# Patient Record
Sex: Female | Born: 1985 | Race: White | Hispanic: No | Marital: Married | Smoking: Never smoker
Health system: Southern US, Community
[De-identification: ages and names within clinical notes are randomized; demographics above are authoritative.]

## PROBLEM LIST (undated history)

## (undated) DIAGNOSIS — T7840XA Allergy, unspecified, initial encounter: Secondary | ICD-10-CM

## (undated) DIAGNOSIS — N200 Calculus of kidney: Secondary | ICD-10-CM

## (undated) HISTORY — DX: Allergy, unspecified, initial encounter: T78.40XA

## (undated) HISTORY — DX: Calculus of kidney: N20.0

---

## 2005-06-02 DIAGNOSIS — N200 Calculus of kidney: Secondary | ICD-10-CM

## 2005-06-02 HISTORY — DX: Calculus of kidney: N20.0

## 2008-06-02 HISTORY — PX: MOUTH SURGERY: SHX715

## 2014-09-28 ENCOUNTER — Emergency Department (INDEPENDENT_AMBULATORY_CARE_PROVIDER_SITE_OTHER): Payer: 59

## 2014-09-28 ENCOUNTER — Emergency Department (INDEPENDENT_AMBULATORY_CARE_PROVIDER_SITE_OTHER)
Admission: EM | Admit: 2014-09-28 | Discharge: 2014-09-28 | Disposition: A | Discharge status: Home / Self Care | Payer: 59 | Source: Home / Self Care | Attending: Emergency Medicine | Admitting: Emergency Medicine

## 2014-09-28 ENCOUNTER — Encounter (HOSPITAL_COMMUNITY): Payer: Self-pay | Admitting: Emergency Medicine

## 2014-09-28 DIAGNOSIS — Z87828 Personal history of other (healed) physical injury and trauma: Secondary | ICD-10-CM

## 2014-09-28 DIAGNOSIS — J9801 Acute bronchospasm: Secondary | ICD-10-CM | POA: Diagnosis not present

## 2014-09-28 DIAGNOSIS — M542 Cervicalgia: Secondary | ICD-10-CM

## 2014-09-28 DIAGNOSIS — J302 Other seasonal allergic rhinitis: Secondary | ICD-10-CM

## 2014-09-28 MED ORDER — IPRATROPIUM BROMIDE 0.06 % NA SOLN: 2.0000 | Freq: Four times a day (QID) | NASAL | Status: AC

## 2014-09-28 MED ORDER — TRIAMCINOLONE ACETONIDE 40 MG/ML IJ SUSP
40.0000 mg | Freq: Once | INTRAMUSCULAR | Status: AC
  Administered 2014-09-28: 40 mg via INTRAMUSCULAR

## 2014-09-28 MED ORDER — ALBUTEROL SULFATE (2.5 MG/3ML) 0.083% IN NEBU
INHALATION_SOLUTION | RESPIRATORY_TRACT | Status: AC
  Filled 2014-09-28: qty 3

## 2014-09-28 MED ORDER — ALBUTEROL SULFATE HFA 108 (90 BASE) MCG/ACT IN AERS: 2.0000 | INHALATION_SPRAY | RESPIRATORY_TRACT | Status: AC | PRN

## 2014-09-28 MED ORDER — TRIAMCINOLONE ACETONIDE 40 MG/ML IJ SUSP
INTRAMUSCULAR | Status: AC
  Filled 2014-09-28: qty 1

## 2014-09-28 MED ORDER — IPRATROPIUM-ALBUTEROL 0.5-2.5 (3) MG/3ML IN SOLN
3.0000 mL | Freq: Once | RESPIRATORY_TRACT | Status: AC
  Administered 2014-09-28: 3 mL via RESPIRATORY_TRACT

## 2014-09-28 MED ORDER — ALBUTEROL SULFATE (2.5 MG/3ML) 0.083% IN NEBU
2.5000 mg | INHALATION_SOLUTION | Freq: Once | RESPIRATORY_TRACT | Status: AC
  Administered 2014-09-28: 2.5 mg via RESPIRATORY_TRACT

## 2014-09-28 MED ORDER — PREDNISONE 20 MG PO TABS: ORAL_TABLET | ORAL | Status: DC

## 2014-09-28 MED ORDER — IPRATROPIUM-ALBUTEROL 0.5-2.5 (3) MG/3ML IN SOLN
RESPIRATORY_TRACT | Status: AC
Start: 2014-09-28 — End: 2014-09-28
  Filled 2014-09-28: qty 3

## 2014-09-28 NOTE — ED Notes (Signed)
C/o  Fever.  Productive cough with yellow sputum.  Body aches.  Sore throat.  Sob.  Chest pain.     Also having neck and shoulder pain.   No relief with otc meds.   Symptoms present since Tuesday.

## 2014-09-28 NOTE — Discharge Instructions (Signed)
Allergic Rhinitis Flonase nasal spray Lots of saline nasal spray then blow your nose   Allergic rhinitis is when the mucous membranes in the nose respond to allergens. Allergens are particles in the air that cause your body to have an allergic reaction. This causes you to release allergic antibodies. Through a chain of events, these eventually cause you to release histamine into the blood stream. Although meant to protect the body, it is this release of histamine that causes your discomfort, such as frequent sneezing, congestion, and an itchy, runny nose.  CAUSES  Seasonal allergic rhinitis (hay fever) is caused by pollen allergens that may come from grasses, trees, and weeds. Year-round allergic rhinitis (perennial allergic rhinitis) is caused by allergens such as house dust mites, pet dander, and mold spores.  SYMPTOMS  1. Nasal stuffiness (congestion). 2. Itchy, runny nose with sneezing and tearing of the eyes. DIAGNOSIS  Your health care provider can help you determine the allergen or allergens that trigger your symptoms. If you and your health care provider are unable to determine the allergen, skin or blood testing may be used. TREATMENT  Allergic rhinitis does not have a cure, but it can be controlled by:  Medicines and allergy shots (immunotherapy).  Avoiding the allergen. Hay fever may often be treated with antihistamines in pill or nasal spray forms. Antihistamines block the effects of histamine. There are over-the-counter medicines that may help with nasal congestion and swelling around the eyes. Check with your health care provider before taking or giving this medicine.  If avoiding the allergen or the medicine prescribed do not work, there are many new medicines your health care provider can prescribe. Stronger medicine may be used if initial measures are ineffective. Desensitizing injections can be used if medicine and avoidance does not work. Desensitization is when a patient is  given ongoing shots until the body becomes less sensitive to the allergen. Make sure you follow up with your health care provider if problems continue. HOME CARE INSTRUCTIONS It is not possible to completely avoid allergens, but you can reduce your symptoms by taking steps to limit your exposure to them. It helps to know exactly what you are allergic to so that you can avoid your specific triggers. SEEK MEDICAL CARE IF:   You have a fever.  You develop a cough that does not stop easily (persistent).  You have shortness of breath.  You start wheezing.  Symptoms interfere with normal daily activities. Document Released: 02/11/2001 Document Revised: 05/24/2013 Document Reviewed: 01/24/2013 [facility] Patient Information 2015 [place], [place]. This information is not intended to replace advice given to you by your health care provider. Make sure you discuss any questions you have with your health care provider.  Upper Respiratory Infection, Adult An upper respiratory infection (URI) is also sometimes known as the common cold. The upper respiratory tract includes the nose, sinuses, throat, trachea, and bronchi. Bronchi are the airways leading to the lungs. Most people improve within 1 week, but symptoms can last up to 2 weeks. A residual cough may last even longer.  CAUSES Many different viruses can infect the tissues lining the upper respiratory tract. The tissues become irritated and inflamed and often become very moist. Mucus production is also common. A cold is contagious. You can easily spread the virus to others by oral contact. This includes kissing, sharing a glass, coughing, or sneezing. Touching your mouth or nose and then touching a surface, which is then touched by another person, can also spread the virus.  SYMPTOMS  Symptoms typically develop 1 to 3 days after you come in contact with a cold virus. Symptoms vary from person to person. They may include: 3. Runny  nose. 4. Sneezing. 5. Nasal congestion. 6. Sinus irritation. 7. Sore throat. 8. Loss of voice (laryngitis). 9. Cough. 10. Fatigue. 11. Muscle aches. 12. Loss of appetite. 13. Headache. 14. Low-grade fever. DIAGNOSIS  You might diagnose your own cold based on familiar symptoms, since most people get a cold 2 to 3 times a year. Your caregiver can confirm this based on your exam. Most importantly, your caregiver can check that your symptoms are not due to another disease such as strep throat, sinusitis, pneumonia, asthma, or epiglottitis. Blood tests, throat tests, and X-rays are not necessary to diagnose a common cold, but they may sometimes be helpful in excluding other more serious diseases. Your caregiver will decide if any further tests are required. RISKS AND COMPLICATIONS  You may be at risk for a more severe case of the common cold if you smoke cigarettes, have chronic heart disease (such as heart failure) or lung disease (such as asthma), or if you have a weakened immune system. The very young and very old are also at risk for more serious infections. Bacterial sinusitis, middle ear infections, and bacterial pneumonia can complicate the common cold. The common cold can worsen asthma and chronic obstructive pulmonary disease (COPD). Sometimes, these complications can require emergency medical care and may be life-threatening. PREVENTION  The best way to protect against getting a cold is to practice good hygiene. Avoid oral or hand contact with people with cold symptoms. Wash your hands often if contact occurs. There is no clear evidence that vitamin C, vitamin E, echinacea, or exercise reduces the chance of developing a cold. However, it is always recommended to get plenty of rest and practice good nutrition. TREATMENT  Treatment is directed at relieving symptoms. There is no cure. Antibiotics are not effective, because the infection is caused by a virus, not by bacteria. Treatment may  include:  Increased fluid intake. Sports drinks offer valuable electrolytes, sugars, and fluids.  Breathing heated mist or steam (vaporizer or shower).  Eating chicken soup or other clear broths, and maintaining good nutrition.  Getting plenty of rest.  Using gargles or lozenges for comfort.  Controlling fevers with ibuprofen or acetaminophen as directed by your caregiver.  Increasing usage of your inhaler if you have asthma. Zinc gel and zinc lozenges, taken in the first 24 hours of the common cold, can shorten the duration and lessen the severity of symptoms. Pain medicines may help with fever, muscle aches, and throat pain. A variety of non-prescription medicines are available to treat congestion and runny nose. Your caregiver can make recommendations and may suggest nasal or lung inhalers for other symptoms.  HOME CARE INSTRUCTIONS   Only take over-the-counter or prescription medicines for pain, discomfort, or fever as directed by your caregiver.  Use a warm mist humidifier or inhale steam from a shower to increase air moisture. This may keep secretions moist and make it easier to breathe.  Drink enough water and fluids to keep your urine clear or pale yellow.  Rest as needed.  Return to work when your temperature has returned to normal or as your caregiver advises. You may need to stay home longer to avoid infecting others. You can also use a face mask and careful hand washing to prevent spread of the virus. SEEK MEDICAL CARE IF:   After the first  few days, you feel you are getting worse rather than better.  You need your caregiver's advice about medicines to control symptoms.  You develop chills, worsening shortness of breath, or brown or red sputum. These may be signs of pneumonia.  You develop yellow or brown nasal discharge or pain in the face, especially when you bend forward. These may be signs of sinusitis.  You develop a fever, swollen neck glands, pain with  swallowing, or white areas in the back of your throat. These may be signs of strep throat. SEEK IMMEDIATE MEDICAL CARE IF:   You have a fever.  You develop severe or persistent headache, ear pain, sinus pain, or chest pain.  You develop wheezing, a prolonged cough, cough up blood, or have a change in your usual mucus (if you have chronic lung disease).  You develop sore muscles or a stiff neck. Document Released: 11/12/2000 Document Revised: 08/11/2011 Document Reviewed: 08/24/2013 [facility] Patient Information 2015 [place], [place]. This information is not intended to replace advice given to you by your health care provider. Make sure you discuss any questions you have with your health care provider.  How to Use an Inhaler Using your inhaler correctly is very important. Good technique will make sure that the medicine reaches your lungs.  HOW TO USE AN INHALER: 15. Take the cap off the inhaler. 16. If this is the first time using your inhaler, you need to prime it. Shake the inhaler for 5 seconds. Release four puffs into the air, away from your face. Ask your doctor for help if you have questions. 17. Shake the inhaler for 5 seconds. 18. Turn the inhaler so the bottle is above the mouthpiece. 19. Put your pointer finger on top of the bottle. Your thumb holds the bottom of the inhaler. 20. Open your mouth. 21. Either hold the inhaler away from your mouth (the width of 2 fingers) or place your lips tightly around the mouthpiece. Ask your doctor which way to use your inhaler. 22. Breathe out as much air as possible. 23. Breathe in and push down on the bottle 1 time to release the medicine. You will feel the medicine go in your mouth and throat. 24. Continue to take a deep breath in very slowly. Try to fill your lungs. 25. After you have breathed in completely, hold your breath for 10 seconds. This will help the medicine to settle in your lungs. If you cannot hold your breath for 10 seconds, hold  it for as long as you can before you breathe out. 26. Breathe out slowly, through pursed lips. Whistling is an example of pursed lips. 27. If your doctor has told you to take more than 1 puff, wait at least 15-30 seconds between puffs. This will help you get the best results from your medicine. Do not use the inhaler more than your doctor tells you to. 28. Put the cap back on the inhaler. 29. Follow the directions from your doctor or from the inhaler package about cleaning the inhaler. If you use more than one inhaler, ask your doctor which inhalers to use and what order to use them in. Ask your doctor to help you figure out when you will need to refill your inhaler.  If you use a steroid inhaler, always rinse your mouth with water after your last puff, gargle and spit out the water. Do not swallow the water. GET HELP IF:  The inhaler medicine only partially helps to stop wheezing or shortness of breath.  You are having trouble using your inhaler.  You have some increase in thick spit (phlegm). GET HELP RIGHT AWAY IF:  The inhaler medicine does not help your wheezing or shortness of breath or you have tightness in your chest.  You have dizziness, headaches, or fast heart rate.  You have chills, fever, or night sweats.  You have a large increase of thick spit, or your thick spit is bloody. MAKE SURE YOU:    Cervical Sprain A cervical sprain is when the tissues (ligaments) that hold the neck bones in place stretch or tear. HOME CARE  Put ice on the injured area. Put ice in a plastic bag. Place a towel between your skin and the bag. Leave the ice on for 15-20 minutes, 3-4 times a day. You may have been given a collar to wear. This collar keeps your neck from moving while you heal. Do not take the collar off unless told by your doctor. If you have long hair, keep it outside of the collar. Ask your doctor before changing the position of your collar. You may need to change its position  over time to make it more comfortable. If you are allowed to take off the collar for cleaning or bathing, follow your doctor's instructions on how to do it safely. Keep your collar clean by wiping it with mild soap and water. Dry it completely. If the collar has removable pads, remove them every 1-2 days to hand wash them with soap and water. Allow them to air dry. They should be dry before you wear them in the collar. Do not drive while wearing the collar. Only take medicine as told by your doctor. Keep all doctor visits as told. Keep all physical therapy visits as told. Adjust your work station so that you have good posture while you work. Avoid positions and activities that make your problems worse. Warm up and stretch before being active. GET HELP IF: Your pain is not controlled with medicine. You cannot take less pain medicine over time as planned. Your activity level does not improve as expected. GET HELP RIGHT AWAY IF:  You are bleeding. Your stomach is upset. You have an allergic reaction to your medicine. You develop new problems that you cannot explain. You lose feeling (become numb) or you cannot move any part of your body (paralysis). You have tingling or weakness in any part of your body. Your symptoms get worse. Symptoms include: Pain, soreness, stiffness, puffiness (swelling), or a burning feeling in your neck. Pain when your neck is touched. Shoulder or upper back pain. Limited ability to move your neck. Headache. Dizziness. Your hands or arms feel week, lose feeling, or tingle. Muscle spasms. Difficulty swallowing or chewing. MAKE SURE YOU:  Understand these instructions. Will watch your condition. Will get help right away if you are not doing well or get worse. Document Released: 11/05/2007 Document Revised: 01/19/2013 Document Reviewed: 11/24/2012 [facility] Patient Information 2015 [place], [place]. This information is not intended to replace advice given to you  by your health care provider. Make sure you discuss any questions you have with your health care provider.  Understand these instructions.  Will watch your condition. Bronchospasm A bronchospasm is a spasm or tightening of the airways going into the lungs. During a bronchospasm breathing becomes more difficult because the airways get smaller. When this happens there can be coughing, a whistling sound when breathing (wheezing), and difficulty breathing. Bronchospasm is often associated with asthma, but not all patients who experience  a bronchospasm have asthma. CAUSES  A bronchospasm is caused by inflammation or irritation of the airways. The inflammation or irritation may be triggered by:  Allergies (such as to animals, pollen, food, or mold). Allergens that cause bronchospasm may cause wheezing immediately after exposure or many hours later.  Infection. Viral infections are believed to be the most common cause of bronchospasm.  Exercise.  Irritants (such as pollution, cigarette smoke, strong odors, aerosol sprays, and paint fumes).  Weather changes. Winds increase molds and pollens in the air. Rain refreshes the air by washing irritants out. Cold air may cause inflammation.  Stress and emotional upset.  SIGNS AND SYMPTOMS  Wheezing.  Excessive nighttime coughing.  Frequent or severe coughing with a simple cold.  Chest tightness.  Shortness of breath.  DIAGNOSIS  Bronchospasm is usually diagnosed through a history and physical exam. Tests, such as chest X-rays, are sometimes done to look for other conditions. TREATMENT  Inhaled medicines can be given to open up your airways and help you breathe. The medicines can be given using either an inhaler or a nebulizer machine. Corticosteroid medicines may be given for severe bronchospasm, usually when it is associated with asthma. HOME CARE INSTRUCTIONS  Always have a plan prepared for seeking medical care. Know when to call your health  care provider and local emergency services (911 in the [place] Know where you can access local emergency care. Only take medicines as directed by your health care provider. If you were prescribed an inhaler or nebulizer machine, ask your health care provider to explain how to use it correctly. Always use a spacer with your inhaler if you were given one. It is necessary to remain calm during an attack. Try to relax and breathe more slowly. Control your home environment in the following ways:  Change your heating and air conditioning filter at least once a month.  Limit your use of fireplaces and wood stoves. Do not smoke and do not allow smoking in your home.  Avoid exposure to perfumes and fragrances.  Get rid of pests (such as roaches and mice) and their droppings.  Throw away plants if you see mold on them.  Keep your house clean and dust free.  Replace carpet with wood, tile, or vinyl flooring. Carpet can trap dander and dust.  Use allergy-proof pillows, mattress covers, and box spring covers.  Wash bed sheets and blankets every week in hot water and dry them in a dryer.  Use blankets that are made of polyester or cotton.  Wash hands frequently. SEEK MEDICAL CARE IF:  You have muscle aches.  You have chest pain.  The sputum changes from clear or white to yellow, green, gray, or bloody.  The sputum you cough up gets thicker.  There are problems that may be related to the medicine you are given, such as a rash, itching, swelling, or trouble breathing.  SEEK IMMEDIATE MEDICAL CARE IF:  You have worsening wheezing and coughing even after taking your prescribed medicines.  You have increased difficulty breathing.  You develop severe chest pain. MAKE SURE YOU:  Understand these instructions. Will watch your condition. Will get help right away if you are not doing well or get worse. Document Released: 05/22/2003 Document Revised: 05/24/2013 Document Reviewed:  11/08/2012 [facility] Patient Information 2015 [place]. This information is not intended to replace advice given to you by your health care provider. Make sure you discuss any questions you have with your health care provider.   Will get  help right away if you are not doing well or get worse. Document Released: 02/26/2008 Document Revised: 03/09/2013 Document Reviewed: 12/16/2012 [facility] Patient Information 2015 [place], [place]. This information is not intended to replace advice given to you by your health care provider. Make sure you discuss any questions you have with your health care provider.

## 2014-09-28 NOTE — ED Provider Notes (Addendum)
CSN: [id number]     Arrival date & time 09/28/14  1856 History   First MD Initiated Contact with Patient 09/28/14 2034     Chief Complaint  Patient presents with  . Influenza   (Consider location/radiation/quality/duration/timing/severity/associated sxs/prior Treatment) HPI Comments: 29 year old female presents with a 48 hour history of cough, nasal congestion, body aches, chest pain particularly with cough and today developing a fever. She has taken Tylenol only.  Her second complaint is that of posterior neck pain. She was playing rugby approximately 2 weeks ago and she had a head impaction with another player and she experience pain in the lower cervical spine. She had a similar-type injury 1 week ago and began injured her neck. She states she felt pins and needles in her whole body like her nursing home fire and has an occasional numbness to her bilateral thumbs. She has limited range of motion to her neck.    History reviewed. No pertinent past medical history. History reviewed. No pertinent past surgical history. History reviewed. No pertinent family history. History  Substance Use Topics  . Smoking status: Never Smoker   . Smokeless tobacco: Not on file  . Alcohol Use: No   OB History    No data available     Review of Systems  Constitutional: Positive for fever and activity change.  HENT: Positive for congestion, postnasal drip and sinus pressure. Negative for facial swelling and trouble swallowing.   Respiratory: Positive for cough and shortness of breath.   Cardiovascular: Positive for chest pain. Negative for palpitations.  Gastrointestinal: Negative.   Musculoskeletal: Positive for neck pain.  Skin: Negative.   Neurological: Negative for dizziness, tremors, syncope, facial asymmetry, speech difficulty and headaches.    Allergies  Review of patient's allergies indicates no known allergies.  Home Medications   Prior to Admission medications   Medication Sig Start  Date End Date Taking? Authorizing Provider  albuterol (PROVENTIL HFA;VENTOLIN HFA) 108 (90 BASE) MCG/ACT inhaler Inhale 2 puffs into the lungs every 4 (four) hours as needed for wheezing or shortness of breath. 09/28/14   Hayden Rasmussenavid Augusto Deckman, NP  ipratropium (ATROVENT) 0.06 % nasal spray Place 2 sprays into both nostrils 4 (four) times daily. 09/28/14   Hayden Rasmussenavid Lempi Edwin, NP  predniSONE (DELTASONE) 20 MG tablet Take 3 tabs po on first day, 2 tabs second day, 2 tabs third day, 1 tab fourth day, 1 tab 5th day. Take with food. 09/28/14   Hayden Rasmussenavid Elmire Amrein, NP   BP 117/82 mmHg  Pulse 83  Temp(Src) 99.1 F (37.3 C) (Oral)  Resp 20  SpO2 98%  LMP 09/13/2014 Physical Exam  Constitutional: She is oriented to person, place, and time. She appears well-developed and well-nourished. No distress.  HENT:  Mouth/Throat: No oropharyngeal exudate.  Bilateral TMs are normal Oropharynx with PND, mild erythema and cobblestoning.  Eyes: EOM are normal. Pupils are equal, round, and reactive to light.  Neck:  Tenderness at the base of the cervical spine and T1. She is able to rotate the neck approximate 30 left and right and flex for approximately 45. No deformity, swelling, discoloration or abnormal bony movement/crepitus or obvious malalignment.  Cardiovascular: Normal rate, regular rhythm and normal heart sounds.   Pulmonary/Chest: Effort normal. She has wheezes. She has rales.  Bilateral chest sounds with coarseness, rhonchi and wheezing. Expiratory phase prolonged.  Musculoskeletal: She exhibits no edema or tenderness.  Lymphadenopathy:    She has no cervical adenopathy.  Neurological: She is alert and oriented to person, place, and  time. No cranial nerve deficit. She exhibits normal muscle tone. Coordination normal.  Skin: Skin is warm and dry.  Nursing note and vitals reviewed.   ED Course  Procedures (including critical care time) Labs Review Labs Reviewed - No data to display  Imaging Review Dg Chest 2  View  09/28/2014   CLINICAL DATA:  29 year old female with cough, fever, body aches and sore throat  EXAM: CHEST  2 VIEW  COMPARISON:  None.  FINDINGS: The lungs are clear and negative for focal airspace consolidation, pulmonary edema or suspicious pulmonary nodule. No pleural effusion or pneumothorax. Cardiac and mediastinal contours are within normal limits. No acute fracture or lytic or blastic osseous lesions. The visualized upper abdominal bowel gas pattern is unremarkable.  IMPRESSION: Normal chest x-ray.   Electronically Signed   By: Malachy Moan M.D.   On: 09/28/2014 21:23     MDM   1. Other seasonal allergic rhinitis   2. Acute bronchospasm   3. Posterior neck pain   4. History of cervical spine trauma   Kenalog 40 mg IM Post DuoNeb 5 mg/2.5 mg there is potential and improvement and air movement and near absence of adventitious sounds. Lungs are primarily clear with rare wheeze. Patient feels better and breathing better. X-ray as above, no evidence of pneumonia.  Was recommended to the patient that she have a C-spine x-ray series. After consulting with Dr. Piedad Climes at 8:45 PM it was decided that it would be best to send her to the emergency department for C-spine evaluation after we treat her respiratory illness. In discussing this with patient she stated that she could not afford to do that and request a referral to a spinal specialist or orthopedist. She will also be placed in a soft c-collar and be on self restricted activity. Take ibuprofen or Aleve as directed for the next 2-3 days for inflammation Alka-Seltzer cold plus medicine for the next couple days. Then switch to Allegra or Claritin. Use lots of saline nasal spray Flonase nasal spray Atrovent nasal spray as directed Lots of fluids Albuterol HFA Prednisone taper Must follow-up with orthopedist as above for your neck. Wear c-collar until the physician states it is okay not to.    Hayden Rasmussen, NP 09/28/14 2156  Hayden Rasmussen, NP 10/12/14 2131

## 2014-10-16 ENCOUNTER — Encounter: Payer: Self-pay | Admitting: Physician Assistant

## 2014-10-16 ENCOUNTER — Ambulatory Visit (INDEPENDENT_AMBULATORY_CARE_PROVIDER_SITE_OTHER): Payer: 59 | Admitting: Physician Assistant

## 2014-10-16 VITALS — BP 106/68 | HR 65 | Temp 97.9°F | Resp 16 | Ht 65.25 in | Wt 168.2 lb

## 2014-10-16 DIAGNOSIS — Z23 Encounter for immunization: Secondary | ICD-10-CM

## 2014-10-16 DIAGNOSIS — Z130 Encounter for screening for diseases of the blood and blood-forming organs and certain disorders involving the immune mechanism: Secondary | ICD-10-CM

## 2014-10-16 DIAGNOSIS — Z113 Encounter for screening for infections with a predominantly sexual mode of transmission: Secondary | ICD-10-CM

## 2014-10-16 DIAGNOSIS — Z131 Encounter for screening for diabetes mellitus: Secondary | ICD-10-CM | POA: Diagnosis not present

## 2014-10-16 DIAGNOSIS — Z1329 Encounter for screening for other suspected endocrine disorder: Secondary | ICD-10-CM

## 2014-10-16 DIAGNOSIS — M542 Cervicalgia: Secondary | ICD-10-CM

## 2014-10-16 DIAGNOSIS — Z13220 Encounter for screening for lipoid disorders: Secondary | ICD-10-CM | POA: Diagnosis not present

## 2014-10-16 DIAGNOSIS — Z0000 Encounter for general adult medical examination without abnormal findings: Secondary | ICD-10-CM | POA: Diagnosis not present

## 2014-10-16 LAB — CBC
HEMATOCRIT: 36 % (ref 36.0–46.0)
Hemoglobin: 11.9 g/dL — ABNORMAL LOW (ref 12.0–15.0)
MCH: 29.8 pg (ref 26.0–34.0)
MCHC: 33.1 g/dL (ref 30.0–36.0)
MCV: 90.2 fL (ref 78.0–100.0)
MPV: 9.9 fL (ref 8.6–12.4)
Platelets: 310 10*3/uL (ref 150–400)
RBC: 3.99 MIL/uL (ref 3.87–5.11)
RDW: 13.8 % (ref 11.5–15.5)
WBC: 9.6 10*3/uL (ref 4.0–10.5)

## 2014-10-16 NOTE — Progress Notes (Signed)
   Subjective:    Patient ID: [name], female    DOB: Mar 31, 1986, 29 y.o.   MRN: [id number]  HPI    Review of Systems  Constitutional: Negative.   HENT: Negative.   Eyes: Negative.   Respiratory: Negative.   Cardiovascular: Negative.   Gastrointestinal: Negative.   Endocrine: Negative.   Genitourinary: Negative.   Musculoskeletal: Positive for myalgias and neck pain.  Skin: Negative.   Allergic/Immunologic: Positive for environmental allergies.  Neurological: Negative.   Hematological: Negative.   Psychiatric/Behavioral: Negative.        Objective:   Physical Exam        Assessment & Plan:

## 2014-10-16 NOTE — Progress Notes (Signed)
Subjective:    Patient ID: [name], female    DOB: Mar 01, 1986, 29 y.o.   MRN: [id number]  HPI Patient presents for complete physical without any complaints. States that she has been healthy with PMH positive for seasonal allergies and single episode of kidney Gamble in 2007. Denies surgeries save oral surgery for tooth extraction.   Has been married for 4 years (Sept 2011) to female partner and she adds that they are no longer trying to have children has wife had 2 miscarriage over past 2 years. 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 Stays active by running 3 miles 3x/week and biking 10 miles to work daily. Eats a healthy diet full of vegetables and fruits. Drinks water mostly and sweet tea daily. Does not smoke save trying once in high school and denies illicit drugs use. Drinks alcohol 2-3x/month.   Plays rugby and ran into another player's shoulder with her shoulders "the ladder" and had sharp 8/10 neck pain 2 months ago and then had similar incident 1 week following with same exact pain. Heard weird sound and had tingling in thumbs for several days. Tingling has since resolved and pain has improved to 2/10 dull pain. Would like referral to chiropractor.   Health Maintenance: UTD on eye exams. Getting TDAP today. UTD on meningitis vaccine. Last pap 2015 was normal. Does self breast exams. Has gotten 1/3 Gardisil vaccines.   Family hx: mother, brother, father all healthy. Maternal grandmother with skin cancer and DM.   Review of Systems  Constitutional: Negative.  Negative for activity change, appetite change and fatigue.  HENT: Negative.   Eyes: Negative.  Negative for photophobia and visual disturbance.  Respiratory: Negative for cough, shortness of breath and wheezing.   Cardiovascular: Negative.  Negative for chest pain and leg swelling.  Gastrointestinal: Negative.  Negative for nausea, vomiting, diarrhea, constipation and blood in stool.    Endocrine: Negative.   Genitourinary: Negative.   Musculoskeletal: Positive for neck pain. Negative for myalgias, back pain, joint swelling, arthralgias and neck stiffness.  Skin: Negative.   Allergic/Immunologic: Positive for environmental allergies.  Neurological: Negative.  Negative for dizziness, weakness, numbness and headaches.       Objective:   Physical Exam  Constitutional: She is oriented to person, place, and time. She appears well-developed and well-nourished. No distress.  Blood pressure 106/68, pulse 65, temperature 97.9 F (36.6 C), temperature source Oral, resp. rate 16, height 5' 5.25" (1.657 m), weight 168 lb 3.2 oz (76.295 kg), last menstrual period 10/14/2014, SpO2 100 %.  HENT:  Head: Normocephalic and atraumatic.  Right Ear: External ear normal.  Left Ear: External ear normal.  Mouth/Throat: Oropharynx is clear and moist. No oropharyngeal exudate.  Eyes: Conjunctivae and EOM are normal. Pupils are equal, round, and reactive to light. Right eye exhibits no discharge. Left eye exhibits no discharge. No scleral icterus.  Neck: Normal range of motion. Neck supple. No JVD present. No thyromegaly present.  Cardiovascular: Normal rate, regular rhythm, normal heart sounds and intact distal pulses.  Exam reveals no gallop and no friction rub.   No murmur heard. Pulmonary/Chest: Effort normal and breath sounds normal. No respiratory distress. She has no wheezes. She has no rales. She exhibits no tenderness.  Abdominal: Soft. Bowel sounds are normal. She exhibits no distension. There is no tenderness. There is no rebound and no guarding.  Musculoskeletal: Normal range of motion. She exhibits no edema or tenderness.  Right shoulder: Normal.       Left shoulder: Normal.       Cervical back: She exhibits pain. She exhibits normal range of motion, no tenderness, no bony tenderness, no swelling, no edema, no deformity, no laceration and no spasm.       Thoracic back: Normal.        Lumbar back: Normal.  Lymphadenopathy:    She has no cervical adenopathy.  Neurological: She is alert and oriented to person, place, and time. She has normal strength and normal reflexes. She displays no atrophy. No cranial nerve deficit or sensory deficit. She exhibits normal muscle tone. Coordination normal.  Skin: Skin is warm and dry. No rash noted. She is not diaphoretic. No erythema. No pallor.  Psychiatric: She has a normal mood and affect. Her behavior is normal. Judgment and thought content normal.      Assessment & Plan:  1. Physical exam Age anticipatory guidance given.  2. Screening for deficiency anemia - CBC  3. Screening for diabetes mellitus - Comprehensive metabolic panel  4. Screening cholesterol level - Lipid panel  5. Screening for thyroid disorder - TSH  6. Screen for STD (sexually transmitted disease) Unable to give urine for GC probe and did not want to have pelvic for swab.  - RPR - HIV antibody  7. Need for Tdap vaccination - Tdap vaccine greater than or equal to 7yo IM  8. Neck pain Patient would rather have referral to [facility] and Chiropractor than to Ortho. - Amb ref Integr[facility] - Amb ref Chiropractor   Janan Ridgeishira Rose Hippler PA-C  [facility] 10/16/2014 7:41 PM

## 2014-10-16 NOTE — Patient Instructions (Signed)

## 2014-10-17 ENCOUNTER — Ambulatory Visit: Payer: 59 | Admitting: Family Medicine

## 2014-10-17 ENCOUNTER — Encounter: Payer: Self-pay | Admitting: Physician Assistant

## 2014-10-17 LAB — LIPID PANEL
Cholesterol: 146 mg/dL (ref 0–200)
HDL: 51 mg/dL (ref >=46)
LDL Cholesterol: 77 mg/dL (ref 0–99)
Total CHOL/HDL Ratio: 2.9 Ratio
Triglycerides: 89 mg/dL (ref <150)
VLDL: 18 mg/dL (ref 0–40)

## 2014-10-17 LAB — COMPREHENSIVE METABOLIC PANEL
ALK PHOS: 50 U/L (ref 39–117)
ALT: 9 U/L (ref 0–35)
AST: 14 U/L (ref 0–37)
Albumin: 4.3 g/dL (ref 3.5–5.2)
BUN: 10 mg/dL (ref 6–23)
CO2: 27 mEq/L (ref 19–32)
Calcium: 9.2 mg/dL (ref 8.4–10.5)
Chloride: 104 mEq/L (ref 96–112)
Creat: 0.72 mg/dL (ref 0.50–1.10)
GLUCOSE: 91 mg/dL (ref 70–99)
Potassium: 4.3 mEq/L (ref 3.5–5.3)
SODIUM: 139 meq/L (ref 135–145)
TOTAL PROTEIN: 6.9 g/dL (ref 6.0–8.3)
Total Bilirubin: 0.5 mg/dL (ref 0.2–1.2)

## 2014-10-17 LAB — RPR

## 2014-10-17 LAB — HIV ANTIBODY (ROUTINE TESTING W REFLEX): HIV 1&2 Ab, 4th Generation: NONREACTIVE

## 2014-10-17 LAB — TSH: TSH: 0.926 u[IU]/mL (ref 0.350–4.500)

## 2015-03-27 ENCOUNTER — Other Ambulatory Visit: Payer: Self-pay | Admitting: Occupational Medicine

## 2015-03-27 ENCOUNTER — Emergency Department (HOSPITAL_COMMUNITY)
Admission: EM | Admit: 2015-03-27 | Discharge: 2015-03-27 | Disposition: A | Discharge status: Home / Self Care | Payer: 59 | Source: Home / Self Care

## 2015-03-27 ENCOUNTER — Ambulatory Visit: Payer: Self-pay

## 2015-03-27 DIAGNOSIS — R52 Pain, unspecified: Secondary | ICD-10-CM

## 2016-10-02 IMAGING — DX DG CHEST 2V
2 series · 2 of 2 positions shown · non-contrast
Comparison: None.

CLINICAL DATA: 28-year-old female with cough, fever, body aches and
sore throat

EXAM:
CHEST  2 VIEW

[chest pa]
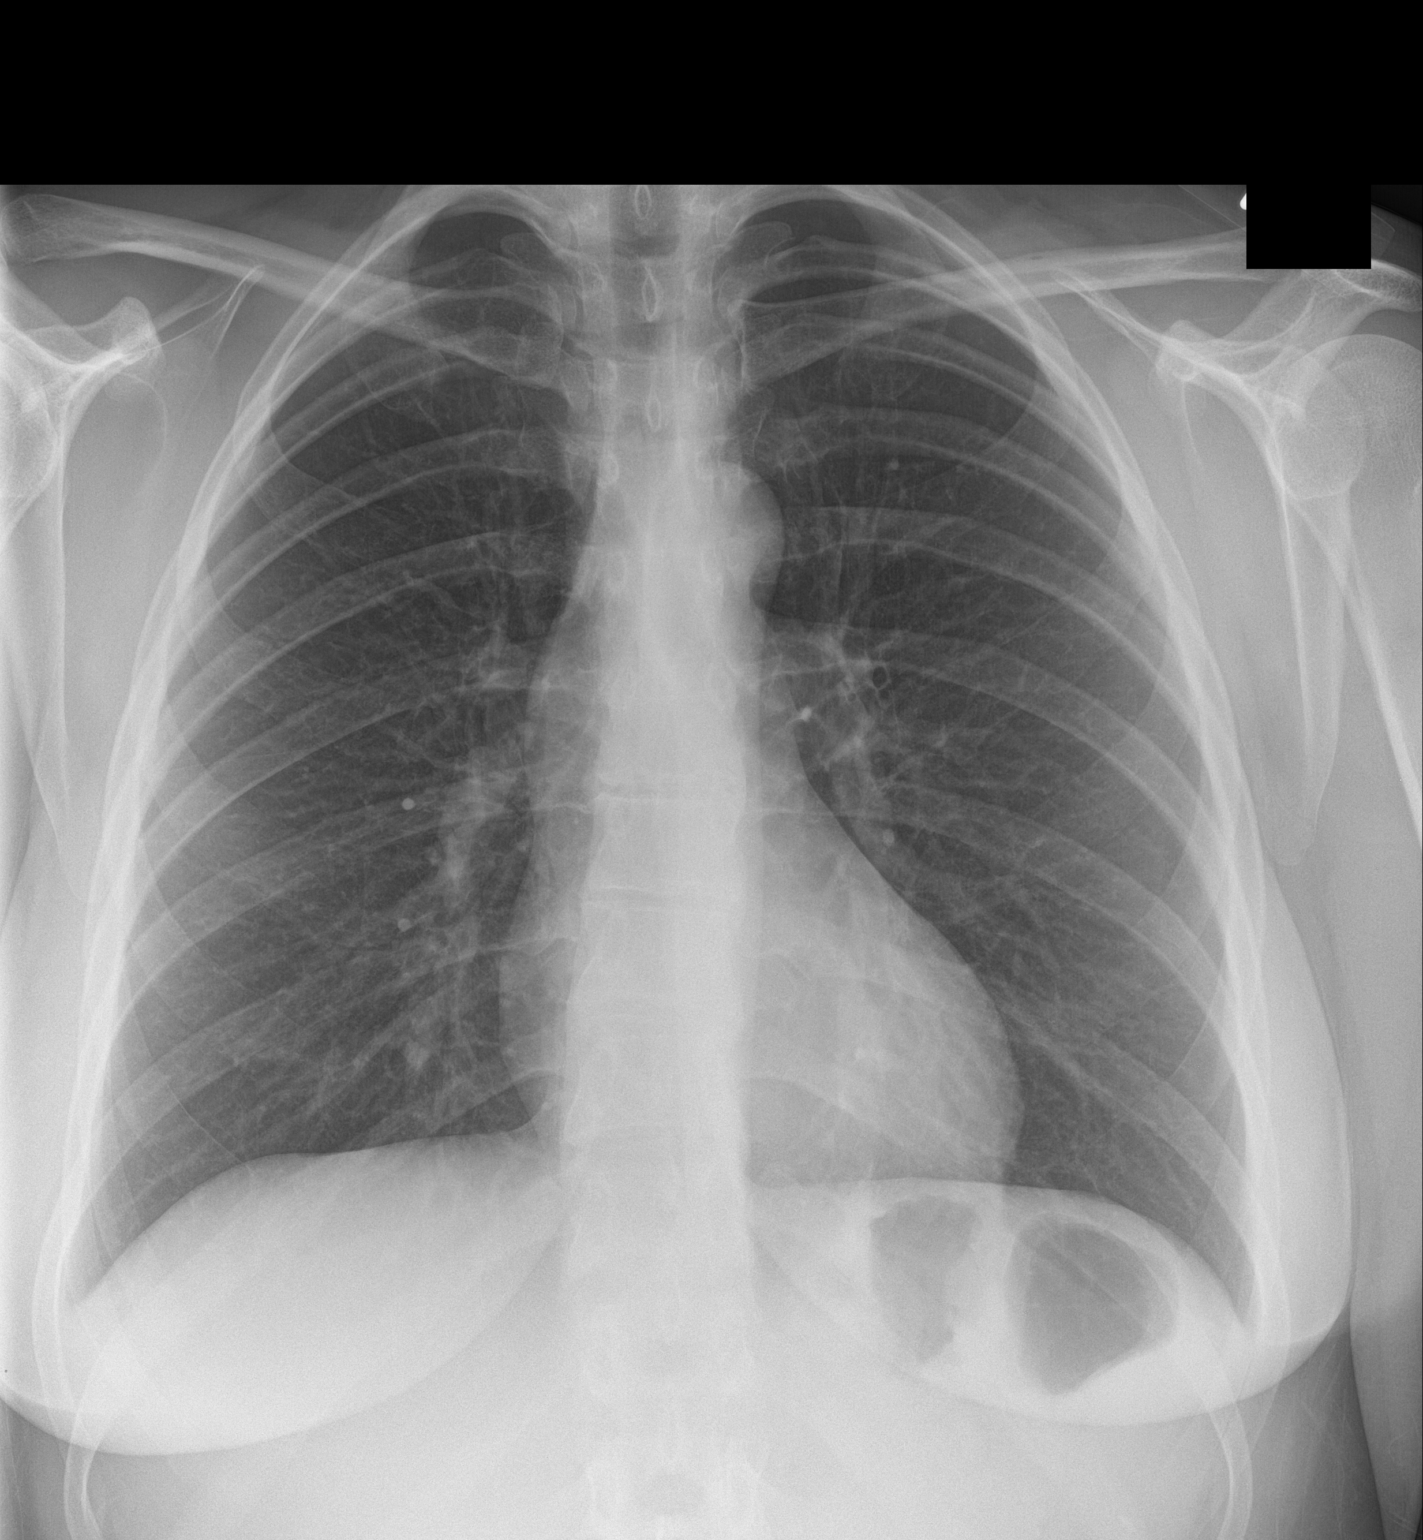

[chest lat]
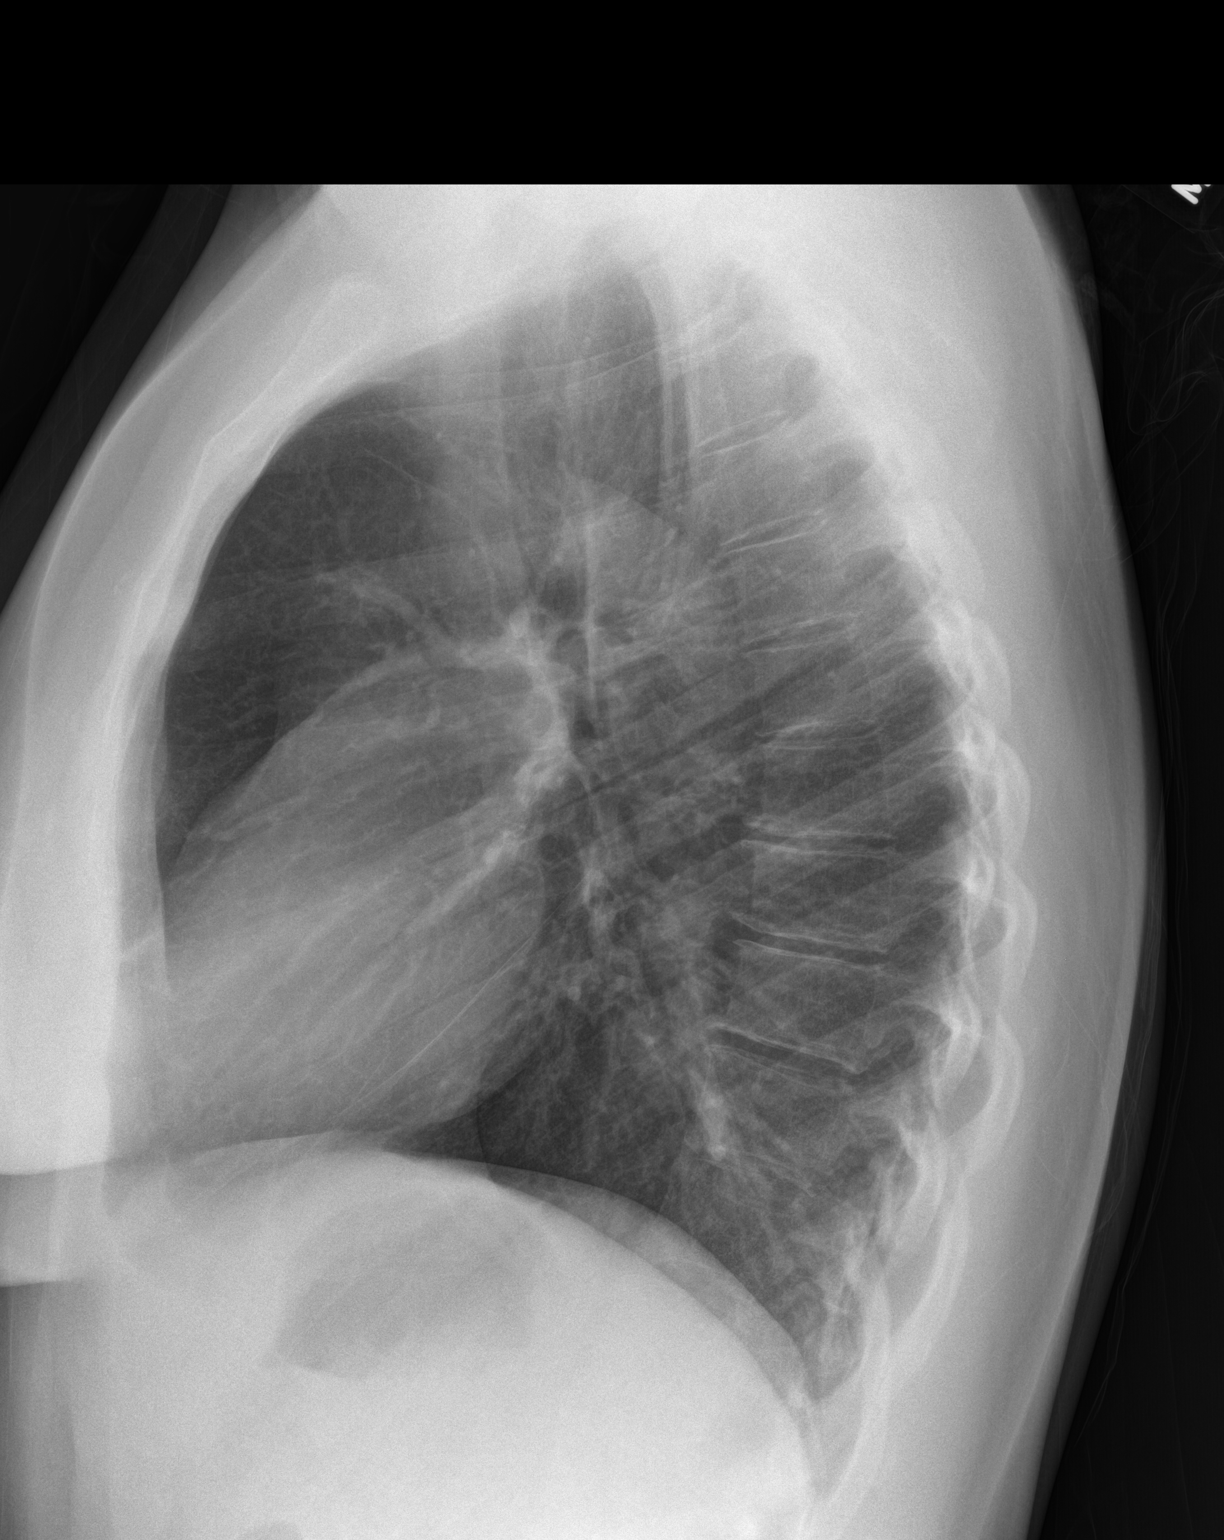

[2 of 2 positions shown; findings below may reference images not displayed]

FINDINGS: The lungs are clear and negative for focal airspace consolidation,
pulmonary edema or suspicious pulmonary nodule. No pleural effusion
or pneumothorax. Cardiac and mediastinal contours are within normal
limits. No acute fracture or lytic or blastic osseous lesions. The
visualized upper abdominal bowel gas pattern is unremarkable.
IMPRESSION: Normal chest x-ray.

## 2018-05-11 ENCOUNTER — Ambulatory Visit: Payer: Self-pay | Admitting: Physician Assistant

## 2018-05-12 ENCOUNTER — Ambulatory Visit: Payer: Self-pay | Admitting: Physician Assistant

## 2018-05-13 ENCOUNTER — Ambulatory Visit: Payer: Self-pay | Admitting: Physician Assistant

## 2019-05-04 ENCOUNTER — Other Ambulatory Visit: Payer: Self-pay

## 2019-05-04 DIAGNOSIS — Z20822 Contact with and (suspected) exposure to covid-19: Secondary | ICD-10-CM

## 2019-05-06 LAB — NOVEL CORONAVIRUS, NAA: SARS-CoV-2, NAA: NOT DETECTED

## 2019-05-09 ENCOUNTER — Telehealth: Payer: Self-pay

## 2019-05-09 NOTE — Telephone Encounter (Signed)
Patient given negative result and verbalized understanding  

## 2019-07-18 ENCOUNTER — Ambulatory Visit: Payer: BC Managed Care – PPO | Attending: Internal Medicine

## 2019-07-18 ENCOUNTER — Other Ambulatory Visit: Payer: Self-pay

## 2019-07-18 DIAGNOSIS — Z20822 Contact with and (suspected) exposure to covid-19: Secondary | ICD-10-CM | POA: Insufficient documentation

## 2019-07-19 LAB — NOVEL CORONAVIRUS, NAA: SARS-CoV-2, NAA: NOT DETECTED

## 2020-02-29 ENCOUNTER — Other Ambulatory Visit: Payer: Self-pay

## 2020-02-29 DIAGNOSIS — Z20822 Contact with and (suspected) exposure to covid-19: Secondary | ICD-10-CM

## 2020-03-01 LAB — NOVEL CORONAVIRUS, NAA: SARS-CoV-2, NAA: NOT DETECTED

## 2020-03-01 LAB — SARS-COV-2, NAA 2 DAY TAT

## 2020-03-31 ENCOUNTER — Other Ambulatory Visit: Payer: BC Managed Care – PPO

## 2020-03-31 ENCOUNTER — Other Ambulatory Visit: Payer: Self-pay

## 2020-03-31 DIAGNOSIS — Z20822 Contact with and (suspected) exposure to covid-19: Secondary | ICD-10-CM

## 2020-04-01 LAB — NOVEL CORONAVIRUS, NAA: SARS-CoV-2, NAA: NOT DETECTED

## 2020-04-01 LAB — SARS-COV-2, NAA 2 DAY TAT

## 2020-06-20 ENCOUNTER — Other Ambulatory Visit: Payer: BC Managed Care – PPO

## 2021-07-12 DIAGNOSIS — R69 Illness, unspecified: Secondary | ICD-10-CM | POA: Diagnosis not present

## 2021-07-12 DIAGNOSIS — R41840 Attention and concentration deficit: Secondary | ICD-10-CM | POA: Diagnosis not present

## 2021-10-01 DIAGNOSIS — R41840 Attention and concentration deficit: Secondary | ICD-10-CM | POA: Diagnosis not present

## 2022-01-16 DIAGNOSIS — L573 Poikiloderma of Civatte: Secondary | ICD-10-CM | POA: Diagnosis not present

## 2022-01-16 DIAGNOSIS — S80862A Insect bite (nonvenomous), left lower leg, initial encounter: Secondary | ICD-10-CM | POA: Diagnosis not present

## 2022-01-16 DIAGNOSIS — S80861A Insect bite (nonvenomous), right lower leg, initial encounter: Secondary | ICD-10-CM | POA: Diagnosis not present

## 2022-01-16 DIAGNOSIS — L814 Other melanin hyperpigmentation: Secondary | ICD-10-CM | POA: Diagnosis not present

## 2022-01-16 DIAGNOSIS — D2230 Melanocytic nevi of unspecified part of face: Secondary | ICD-10-CM | POA: Diagnosis not present

## 2022-01-16 DIAGNOSIS — D225 Melanocytic nevi of trunk: Secondary | ICD-10-CM | POA: Diagnosis not present

## 2022-01-16 DIAGNOSIS — L719 Rosacea, unspecified: Secondary | ICD-10-CM | POA: Diagnosis not present

## 2022-01-20 DIAGNOSIS — R41840 Attention and concentration deficit: Secondary | ICD-10-CM | POA: Diagnosis not present

## 2022-02-26 DIAGNOSIS — Z23 Encounter for immunization: Secondary | ICD-10-CM | POA: Diagnosis not present

## 2022-02-26 DIAGNOSIS — R7303 Prediabetes: Secondary | ICD-10-CM | POA: Diagnosis not present

## 2022-02-26 DIAGNOSIS — Z87442 Personal history of urinary calculi: Secondary | ICD-10-CM | POA: Diagnosis not present

## 2022-02-26 DIAGNOSIS — R41840 Attention and concentration deficit: Secondary | ICD-10-CM | POA: Diagnosis not present

## 2022-02-26 DIAGNOSIS — D509 Iron deficiency anemia, unspecified: Secondary | ICD-10-CM | POA: Diagnosis not present

## 2022-02-26 DIAGNOSIS — Z0000 Encounter for general adult medical examination without abnormal findings: Secondary | ICD-10-CM | POA: Diagnosis not present

## 2022-02-26 DIAGNOSIS — R69 Illness, unspecified: Secondary | ICD-10-CM | POA: Diagnosis not present

## 2022-02-26 DIAGNOSIS — Z8659 Personal history of other mental and behavioral disorders: Secondary | ICD-10-CM | POA: Diagnosis not present
